# Patient Record
Sex: Male | Born: 1970 | Race: Black or African American | Hispanic: No | Marital: Married | Smoking: Never smoker
Health system: Southern US, Community
[De-identification: ages and names within clinical notes are randomized; demographics above are authoritative.]

## PROBLEM LIST (undated history)

## (undated) DIAGNOSIS — Z789 Other specified health status: Secondary | ICD-10-CM

## (undated) HISTORY — PX: ABDOMINAL SURGERY: SHX537

---

## 2007-09-11 ENCOUNTER — Emergency Department (HOSPITAL_COMMUNITY): Admission: EM | Admit: 2007-09-11 | Discharge: 2007-09-11 | Payer: Self-pay | Admitting: Emergency Medicine

## 2008-01-22 ENCOUNTER — Ambulatory Visit: Payer: Self-pay | Admitting: Family Medicine

## 2008-02-16 ENCOUNTER — Ambulatory Visit: Payer: Self-pay | Admitting: Family Medicine

## 2008-02-19 ENCOUNTER — Ambulatory Visit: Payer: Self-pay | Admitting: Hematology and Oncology

## 2010-07-06 ENCOUNTER — Emergency Department (HOSPITAL_COMMUNITY): Admission: EM | Admit: 2010-07-06 | Discharge: 2010-07-06 | Payer: Self-pay | Admitting: Emergency Medicine

## 2011-01-17 ENCOUNTER — Inpatient Hospital Stay (INDEPENDENT_AMBULATORY_CARE_PROVIDER_SITE_OTHER)
Admission: RE | Admit: 2011-01-17 | Discharge: 2011-01-17 | Disposition: A | Discharge status: Home / Self Care | Payer: Self-pay | Source: Ambulatory Visit | Attending: Emergency Medicine | Admitting: Emergency Medicine

## 2011-01-17 DIAGNOSIS — R1013 Epigastric pain: Secondary | ICD-10-CM
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
## 2011-05-31 ENCOUNTER — Encounter: Payer: Self-pay | Admitting: Family Medicine

## 2011-06-05 ENCOUNTER — Encounter: Payer: Self-pay | Admitting: Medical

## 2011-07-09 LAB — COMPREHENSIVE METABOLIC PANEL
ALT: 21
AST: 24
Albumin: 4.2
CO2: 28
Calcium: 9.4
Chloride: 102
Creatinine, Ser: 1.41
GFR calc Af Amer: >60
GFR calc non Af Amer: 58 — ABNORMAL LOW
Sodium: 138
Total Bilirubin: 1

## 2011-07-09 LAB — CBC
MCV: 84
Platelets: 267
RBC: 5.47
WBC: 4.7

## 2011-07-09 LAB — DIFFERENTIAL
Eosinophils Absolute: 0 — ABNORMAL LOW
Eosinophils Relative: 1
Lymphocytes Relative: 35
Lymphs Abs: 1.7
Monocytes Absolute: 0.1

## 2012-11-26 ENCOUNTER — Encounter: Payer: Self-pay | Admitting: Medical

## 2019-02-17 ENCOUNTER — Emergency Department (HOSPITAL_COMMUNITY)
Admission: EM | Admit: 2019-02-17 | Discharge: 2019-02-17 | Disposition: A | Discharge status: Home / Self Care | Payer: Medicaid Other | Attending: Emergency Medicine | Admitting: Emergency Medicine

## 2019-02-17 ENCOUNTER — Other Ambulatory Visit: Payer: Self-pay

## 2019-02-17 ENCOUNTER — Encounter (HOSPITAL_COMMUNITY): Payer: Self-pay

## 2019-02-17 DIAGNOSIS — M545 Low back pain: Secondary | ICD-10-CM | POA: Insufficient documentation

## 2019-02-17 HISTORY — DX: Other specified health status: Z78.9

## 2019-02-17 MED ORDER — NAPROXEN 250 MG PO TABS
500.0000 mg | ORAL_TABLET | Freq: Once | ORAL | Status: AC
  Administered 2019-02-17: 500 mg via ORAL
  Filled 2019-02-17: qty 2

## 2019-02-17 MED ORDER — METHOCARBAMOL 500 MG PO TABS
500.0000 mg | ORAL_TABLET | Freq: Once | ORAL | Status: AC
  Administered 2019-02-17: 500 mg via ORAL
  Filled 2019-02-17: qty 1

## 2019-02-17 MED ORDER — NAPROXEN 500 MG PO TABS: 500.0000 mg | ORAL_TABLET | Freq: Two times a day (BID) | ORAL | 0 refills | Status: DC

## 2019-02-17 MED ORDER — METHOCARBAMOL 500 MG PO TABS: 500.0000 mg | ORAL_TABLET | Freq: Two times a day (BID) | ORAL | 0 refills | Status: DC
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
## 2019-02-17 NOTE — Discharge Instructions (Signed)
Take the prescribed medication as directed-- meds sent to pharmacy as requested. Can also use heating pad to help with muscle soreness. Follow-up with your primary care doctor. Return to the ED for new or worsening symptoms.

## 2019-03-04 ENCOUNTER — Other Ambulatory Visit: Payer: Self-pay

## 2019-03-04 ENCOUNTER — Ambulatory Visit (HOSPITAL_COMMUNITY)
Admission: EM | Admit: 2019-03-04 | Discharge: 2019-03-04 | Disposition: A | Discharge status: Home / Self Care | Payer: Medicaid Other | Attending: Family Medicine | Admitting: Family Medicine

## 2019-03-04 ENCOUNTER — Encounter (HOSPITAL_COMMUNITY): Payer: Self-pay

## 2019-03-04 DIAGNOSIS — L255 Unspecified contact dermatitis due to plants, except food: Secondary | ICD-10-CM

## 2019-03-04 MED ORDER — PREDNISONE 10 MG (21) PO TBPK: ORAL_TABLET | Freq: Every day | ORAL | 0 refills | Status: DC

## 2019-03-04 NOTE — ED Provider Notes (Signed)
[facility]   [id number] 03/04/19 Arrival Time: 0934  ASSESSMENT & PLAN:  1. Rhus dermatitis     Meds ordered this encounter  Medications  . predniSONE (STERAPRED UNI-PAK 21 TAB) 10 MG (21) TBPK tablet    Sig: Take by mouth daily. Take as directed.    Dispense:  21 tablet    Refill:  0   Will follow up with PCP or here if worsening or failing to improve as anticipated. Reviewed expectations re: course of current medical issues. Questions answered. Outlined signs and symptoms indicating need for more acute intervention. Patient verbalized understanding. After Visit Summary given.   SUBJECTIVE:  Corey Crane is a 48 y.o. male who presents with a skin complaint.   Location: L forearm Onset: gradual Duration: 1 week Associated pruritis? moderate Associated pain? none Progression: stable  Drainage? No  Known trigger? No  New soaps/lotions/topicals/detergents/environmental exposures? No Contacts with similar? No Recent travel? No  Other associated symptoms: none Therapies tried thus far: none Arthralgia or myalgia? none Recent illness? none Fever? none No specific aggravating or alleviating factors reported.  ROS: As per HPI.  OBJECTIVE: Vitals:   03/04/19 1030  BP: 126/83  Pulse: 69  Resp: 18  Temp: 98.4 F (36.9 C)  TempSrc: Oral  SpO2: 97%    General appearance: alert; no distress Lungs: clear to auscultation bilaterally Heart: regular rate and rhythm Extremities: no edema Skin: warm and dry; signs of infection: no; approx 3x4 area of linear papules and vesicles with surrounding erythema over L forearm Psychological: alert and cooperative; normal mood and affect  No Known Allergies  Past Medical History:  Diagnosis Date  . Known health problems: none    Social History   Socioeconomic History  . Marital status: Married    Spouse name: Not on file  . Number of children: Not on file  . Years of education: Not on file  . Highest  education level: Not on file  Occupational History  . Not on file  Social Needs  . Financial resource strain: Not on file  . Food insecurity:    Worry: Not on file    Inability: Not on file  . Transportation needs:    Medical: Not on file    Non-medical: Not on file  Tobacco Use  . Smoking status: Never Smoker  . Smokeless tobacco: Never Used  Substance and Sexual Activity  . Alcohol use: Never    Frequency: Never  . Drug use: Never  . Sexual activity: Yes  Lifestyle  . Physical activity:    Days per week: Not on file    Minutes per session: Not on file  . Stress: Not on file  Relationships  . Social connections:    Talks on phone: Not on file    Gets together: Not on file    Attends religious service: Not on file    Active member of club or organization: Not on file    Attends meetings of clubs or organizations: Not on file    Relationship status: Not on file  . Intimate partner violence:    Fear of current or ex partner: Not on file    Emotionally abused: Not on file    Physically abused: Not on file    Forced sexual activity: Not on file  Other Topics Concern  . Not on file  Social History Narrative  . Not on file   Family History  Family history unknown: Yes   Past Surgical History:  Procedure Laterality Date  . ABDOMINAL SURGERY       Mardella Layman, MD 03/04/19 1041

## 2019-03-04 NOTE — ED Triage Notes (Signed)
Pt presents with rash on left arm X 7 days that is itchy.

## 2019-06-14 ENCOUNTER — Other Ambulatory Visit: Payer: Self-pay

## 2019-06-14 ENCOUNTER — Ambulatory Visit (HOSPITAL_COMMUNITY)
Admission: EM | Admit: 2019-06-14 | Discharge: 2019-06-14 | Disposition: A | Discharge status: Home / Self Care | Payer: Medicaid Other | Attending: Family Medicine | Admitting: Family Medicine

## 2019-06-14 DIAGNOSIS — H00014 Hordeolum externum left upper eyelid: Secondary | ICD-10-CM | POA: Diagnosis not present

## 2019-06-14 MED ORDER — SULFAMETHOXAZOLE-TRIMETHOPRIM 800-160 MG PO TABS: 1.0000 | ORAL_TABLET | Freq: Two times a day (BID) | ORAL | 0 refills | Status: DC

## 2019-06-14 MED ORDER — SULFACETAMIDE SODIUM 10 % OP SOLN: 1.0000 [drp] | OPHTHALMIC | 0 refills | Status: DC

## 2019-06-14 NOTE — Discharge Instructions (Addendum)
Warm compresses every couple of hours Expect improvement in a couple of days

## 2019-06-14 NOTE — ED Triage Notes (Signed)
Pt states he has swollen eyelids . Pt states they don't hurts.

## 2019-06-14 NOTE — ED Provider Notes (Signed)
[facility]    CSN: [id number] Arrival date & time: 06/14/19  1009      History   Chief Complaint Chief Complaint  Patient presents with  . stye    HPI Corey Crane is a 48 y.o. male.   HPI  Patient has a stye in each eye.  First in the right eyelid and in the left eyelid.  Mildly painful.  Here for management.  No problems with vision.  No drainage  Past Medical History:  Diagnosis Date  . Known health problems: none     There are no active problems to display for this patient.   Past Surgical History:  Procedure Laterality Date  . ABDOMINAL SURGERY         Home Medications    Prior to Admission medications   Medication Sig Start Date End Date Taking? Authorizing Provider  sulfacetamide (BLEPH-10) 10 % ophthalmic solution Place 1-2 drops into both eyes every 3 (three) hours while awake. 06/14/19   Eustace MooreNelson, Karrina Lye Sue, MD  sulfamethoxazole-trimethoprim (BACTRIM DS) 800-160 MG tablet Take 1 tablet by mouth 2 (two) times daily for 5 days. 06/14/19 06/19/19  Eustace MooreNelson, Essa Wenk Sue, MD    Family History Family History  Family history unknown: Yes    Social History Social History   Tobacco Use  . Smoking status: Never Smoker  . Smokeless tobacco: Never Used  Substance Use Topics  . Alcohol use: Never    Frequency: Never  . Drug use: Never     Allergies   Patient has no known allergies.   Review of Systems Review of Systems  Constitutional: Negative for chills and fever.  HENT: Negative for ear pain and sore throat.   Eyes: Positive for redness. Negative for pain and visual disturbance.  Respiratory: Negative for cough and shortness of breath.   Cardiovascular: Negative for chest pain and palpitations.  Gastrointestinal: Negative for abdominal pain and vomiting.  Genitourinary: Negative for dysuria and hematuria.  Musculoskeletal: Negative for arthralgias and back pain.  Skin: Negative for color change and rash.  Neurological: Negative for  seizures and syncope.  All other systems reviewed and are negative.    Physical Exam Triage Vital Signs ED Triage Vitals  Enc Vitals Group     BP 06/14/19 1029 119/87     Pulse Rate 06/14/19 1029 86     Resp 06/14/19 1029 18     Temp 06/14/19 1029 98.4 F (36.9 C)     Temp Source 06/14/19 1029 Oral     SpO2 06/14/19 1029 97 %     Weight 06/14/19 1026 185 lb (83.9 kg)     Height --      Head Circumference --      Peak Flow --      Pain Score 06/14/19 1025 0     Pain Loc --      Pain Edu? --      Excl. in [place]? --    No data found.  Updated Vital Signs BP 119/87 (BP Location: Right Arm)   Pulse 86   Temp 98.4 F (36.9 C) (Oral)   Resp 18   Wt 83.9 kg   SpO2 97%   BMI 27.32 kg/m   Visual Acuity Right Eye Distance:   Left Eye Distance:   Bilateral Distance:    Right Eye Near:   Left Eye Near:    Bilateral Near:     Physical Exam Constitutional:      General: He is not  in acute distress.    Appearance: He is well-developed.  HENT:     Head: Normocephalic and atraumatic.  Eyes:     Conjunctiva/sclera: Conjunctivae normal.     Pupils: Pupils are equal, round, and reactive to light.     Comments: Small hordeolum is present in each upper eyelid  Neck:     Musculoskeletal: Normal range of motion.  Cardiovascular:     Rate and Rhythm: Normal rate.  Pulmonary:     Effort: Pulmonary effort is normal. No respiratory distress.  Abdominal:     General: There is no distension.     Palpations: Abdomen is soft.  Musculoskeletal: Normal range of motion.  Skin:    General: Skin is warm and dry.  Neurological:     Mental Status: He is alert.      UC Treatments / Results  Labs (all labs ordered are listed, but only abnormal results are displayed) Labs Reviewed - No data to display  EKG   Radiology No results found.  Procedures Procedures (including critical care time)  Medications Ordered in UC Medications - No data to display  Initial Impression /  Assessment and Plan / UC Course  I have reviewed the triage vital signs and the nursing notes.  Pertinent labs & imaging results that were available during my care of the patient were reviewed by me and considered in my medical decision making (see chart for details).   * Final Clinical Impressions(s) / UC Diagnoses   Final diagnoses:  Hordeolum externum of left upper eyelid     Discharge Instructions     Warm compresses every couple of hours Expect improvement in a couple of days   ED Prescriptions    Medication Sig Dispense Auth. Provider   sulfacetamide (BLEPH-10) 10 % ophthalmic solution Place 1-2 drops into both eyes every 3 (three) hours while awake. 10 mL Raylene Everts, MD   sulfamethoxazole-trimethoprim (BACTRIM DS) 800-160 MG tablet Take 1 tablet by mouth 2 (two) times daily for 5 days. 10 tablet Raylene Everts, MD     Controlled Substance Prescriptions [place] Controlled Substance Registry consulted? Not Applicable   Raylene Everts, MD 06/14/19 1047

## 2019-06-17 ENCOUNTER — Other Ambulatory Visit: Payer: Self-pay

## 2019-06-17 ENCOUNTER — Encounter (HOSPITAL_COMMUNITY): Payer: Self-pay | Admitting: Family Medicine

## 2019-06-17 ENCOUNTER — Ambulatory Visit (HOSPITAL_COMMUNITY)
Admission: EM | Admit: 2019-06-17 | Discharge: 2019-06-17 | Disposition: A | Discharge status: Home / Self Care | Payer: Medicaid Other | Attending: Family Medicine | Admitting: Family Medicine

## 2019-06-17 DIAGNOSIS — H00019 Hordeolum externum unspecified eye, unspecified eyelid: Secondary | ICD-10-CM

## 2019-06-17 MED ORDER — DOXYCYCLINE HYCLATE 100 MG PO TABS: 100.0000 mg | ORAL_TABLET | Freq: Two times a day (BID) | ORAL | 0 refills | Status: DC

## 2019-06-17 NOTE — ED Triage Notes (Signed)
Pt presents for follow up for swelling and irritation with both eye lids since Sunday.

## 2019-06-17 NOTE — ED Provider Notes (Signed)
[facility]    CSN: [id number] Arrival date & time: 06/17/19  9678      History   Chief Complaint Chief Complaint  Patient presents with  . Follow-up    HPI Corey Crane is a 48 y.o. male.   estabished [facility] here for f/u of hordeolum seen 9/13  Patient works from home.  Now the other eye has swollen.  He uses hot compresses intermittently.  No fever, vision change.  Taking drops and antibiotic as directed.     Past Medical History:  Diagnosis Date  . Known health problems: none     There are no active problems to display for this patient.   Past Surgical History:  Procedure Laterality Date  . ABDOMINAL SURGERY         Home Medications    Prior to Admission medications   Medication Sig Start Date End Date Taking? Authorizing Provider  doxycycline (VIBRA-TABS) 100 MG tablet Take 1 tablet (100 mg total) by mouth 2 (two) times daily. 06/17/19   Robyn Haber, MD    Family History Family History  Family history unknown: Yes    Social History Social History   Tobacco Use  . Smoking status: Never Smoker  . Smokeless tobacco: Never Used  Substance Use Topics  . Alcohol use: Never    Frequency: Never  . Drug use: Never     Allergies   Patient has no known allergies.   Review of Systems Review of Systems  Eyes: Positive for pain.  All other systems reviewed and are negative.    Physical Exam Triage Vital Signs ED Triage Vitals  Enc Vitals Group     BP      Pulse      Resp      Temp      Temp src      SpO2      Weight      Height      Head Circumference      Peak Flow      Pain Score      Pain Loc      Pain Edu?      Excl. in [place]?    No data found.  Updated Vital Signs BP 124/76 (BP Location: Left Arm)   Pulse 87   Temp 98.3 F (36.8 C) (Oral)   Resp 18   SpO2 98%      Physical Exam Vitals signs and nursing note reviewed.  Constitutional:      General: He is not in acute distress.    Appearance: He is  normal weight. He is not ill-appearing.  Neck:     Musculoskeletal: Normal range of motion.  Pulmonary:     Effort: Pulmonary effort is normal.  Musculoskeletal: Normal range of motion.  Skin:    General: Skin is warm and dry.  Neurological:     General: No focal deficit present.     Mental Status: He is alert and oriented to person, place, and time.  Psychiatric:        Mood and Affect: Mood normal.        Thought Content: Thought content normal.        Judgment: Judgment normal.      UC Treatments / Results  Labs (all labs ordered are listed, but only abnormal results are displayed) Labs Reviewed - No data to display  EKG   Radiology No results found.  Procedures Procedures (including critical care time)  Medications Ordered  in UC Medications - No data to display  Initial Impression / Assessment and Plan / UC Course  I have reviewed the triage vital signs and the nursing notes.  Pertinent labs & imaging results that were available during my care of the patient were reviewed by me and considered in my medical decision making (see chart for details).    Final Clinical Impressions(s) / UC Diagnoses   Final diagnoses:  Hordeolum externum, unspecified laterality     Discharge Instructions     Use the hot compresses every hour while awake.    ED Prescriptions    Medication Sig Dispense Auth. Provider   doxycycline (VIBRA-TABS) 100 MG tablet Take 1 tablet (100 mg total) by mouth 2 (two) times daily. 20 tablet Elvina SidleLauenstein, Navie Lamoreaux, MD     Controlled Substance Prescriptions [place] Controlled Substance Registry consulted? Not Applicable   Elvina SidleLauenstein, Cahterine Heinzel, MD 06/17/19 [phone]

## 2019-06-17 NOTE — Discharge Instructions (Addendum)
Use the hot compresses every hour while awake.

## 2019-09-07 ENCOUNTER — Other Ambulatory Visit: Payer: Self-pay

## 2019-09-07 ENCOUNTER — Encounter (HOSPITAL_COMMUNITY): Payer: Self-pay

## 2019-09-07 ENCOUNTER — Ambulatory Visit (HOSPITAL_COMMUNITY)
Admission: EM | Admit: 2019-09-07 | Discharge: 2019-09-07 | Disposition: A | Discharge status: Home / Self Care | Payer: Medicaid Other | Attending: Internal Medicine | Admitting: Internal Medicine

## 2019-09-07 DIAGNOSIS — H0013 Chalazion right eye, unspecified eyelid: Secondary | ICD-10-CM

## 2019-09-07 DIAGNOSIS — H0016 Chalazion left eye, unspecified eyelid: Secondary | ICD-10-CM

## 2019-09-07 NOTE — ED Triage Notes (Signed)
Patient presents to Urgent Care with complaints of "bumps on both eyelids" since "a while ago". Patient reports he was seen for same in just one eye in the past but now the other eye has one too, pt denies pain.

## 2019-09-07 NOTE — ED Provider Notes (Signed)
Corey Crane    CSN: [id number] Arrival date & time: 09/07/19  6144      History   Chief Complaint Chief Complaint  Patient presents with  . Stye    HPI Corey Crane is a 48 y.o. male with no past medical history comes to urgent care with complaints of swelling in both upper eyelids.  Patient was seen in September and was managed for a stye in the left eye.  Following that the patient says that the swelling in both upper eyelids have progressed.  Is not improved with warm compress.  Both lesions are painless.  No discharge from the eyes.  No changes in his vision.  No known relieving factors.   HPI  Past Medical History:  Diagnosis Date  . Known health problems: none     There are no active problems to display for this patient.   Past Surgical History:  Procedure Laterality Date  . ABDOMINAL SURGERY         Home Medications    Prior to Admission medications   Not on File    Family History Family History  Problem Relation Age of Onset  . Healthy Mother   . Healthy Father     Social History Social History   Tobacco Use  . Smoking status: Never Smoker  . Smokeless tobacco: Never Used  Substance Use Topics  . Alcohol use: Never    Frequency: Never  . Drug use: Never     Allergies   Patient has no known allergies.   Review of Systems Review of Systems  Constitutional: Negative.   HENT: Negative for congestion, postnasal drip and sore throat.   Eyes: Negative for photophobia, pain, discharge, redness, itching and visual disturbance.  Respiratory: Negative.   Cardiovascular: Negative.   Gastrointestinal: Negative.   Genitourinary: Negative.   Musculoskeletal: Negative.  Negative for arthralgias and myalgias.     Physical Exam Triage Vital Signs ED Triage Vitals  Enc Vitals Group     BP 09/07/19 0943 123/75     Pulse Rate 09/07/19 0943 70     Resp 09/07/19 0943 16     Temp 09/07/19 0943 98.5 F (36.9 C)     Temp Source  09/07/19 0943 Oral     SpO2 09/07/19 0943 100 %     Weight --      Height --      Head Circumference --      Peak Flow --      Pain Score 09/07/19 0942 0     Pain Loc --      Pain Edu? --      Excl. in [place]? --    No data found.  Updated Vital Signs BP 123/75 (BP Location: Left Arm)   Pulse 70   Temp 98.5 F (36.9 C) (Oral)   Resp 16   SpO2 100%   Visual Acuity Right Eye Distance:   Left Eye Distance:   Bilateral Distance:    Right Eye Near:   Left Eye Near:    Bilateral Near:     Physical Exam Vitals signs and nursing note reviewed.  Constitutional:      Appearance: He is not ill-appearing.  Eyes:     Comments: Chalazion involving both upper eyelids.  Cardiovascular:     Rate and Rhythm: Normal rate and regular rhythm.     Pulses: Normal pulses.     Heart sounds: Normal heart sounds.  Pulmonary:     Effort:  Pulmonary effort is normal. No respiratory distress.     Breath sounds: Normal breath sounds. No rhonchi or rales.  Abdominal:     General: Bowel sounds are normal.     Palpations: Abdomen is soft.  Musculoskeletal: Normal range of motion.        General: No swelling, tenderness or deformity.  Skin:    General: Skin is warm and dry.     Capillary Refill: Capillary refill takes less than 2 seconds.     Findings: No bruising or erythema.  Neurological:     General: No focal deficit present.     Mental Status: He is alert and oriented to person, place, and time.      UC Treatments / Results  Labs (all labs ordered are listed, but only abnormal results are displayed) Labs Reviewed - No data to display  EKG   Radiology No results found.  Procedures Procedures (including critical care time)  Medications Ordered in UC Medications - No data to display  Initial Impression / Assessment and Plan / UC Course  I have reviewed the triage vital signs and the nursing notes.  Pertinent labs & imaging results that were available during my care of the  patient were reviewed by me and considered in my medical decision making (see chart for details).     1.  Chalazion of both eyes: Patient is advised to follow-up with ophthalmology for further evaluation. In the meantime patient can continue warm compresses. I suspect that the chalazion is a sequelae of external hordeolum in the past. No indication for antibiotics at this time. Final Clinical Impressions(s) / UC Diagnoses   Final diagnoses:  Chalazion of both eyes   Discharge Instructions   None    ED Prescriptions    None     PDMP not reviewed this encounter.   Merrilee Jansky, MD 09/07/19 1101

## 2020-07-31 ENCOUNTER — Other Ambulatory Visit: Payer: Self-pay

## 2020-07-31 ENCOUNTER — Encounter (HOSPITAL_COMMUNITY): Payer: Self-pay | Admitting: *Deleted

## 2020-07-31 ENCOUNTER — Ambulatory Visit (HOSPITAL_COMMUNITY)
Admission: EM | Admit: 2020-07-31 | Discharge: 2020-07-31 | Disposition: A | Discharge status: Home / Self Care | Payer: Medicaid Other | Attending: Family Medicine | Admitting: Family Medicine

## 2020-07-31 DIAGNOSIS — L03012 Cellulitis of left finger: Secondary | ICD-10-CM | POA: Diagnosis not present

## 2020-07-31 MED ORDER — LIDOCAINE HCL 2 % IJ SOLN
INTRAMUSCULAR | Status: AC
  Filled 2020-07-31: qty 20

## 2020-07-31 MED ORDER — DOXYCYCLINE HYCLATE 100 MG PO CAPS: 100.0000 mg | ORAL_CAPSULE | Freq: Two times a day (BID) | ORAL | 0 refills | Status: AC

## 2020-07-31 NOTE — ED Provider Notes (Signed)
[facility]    CSN: [id number] Arrival date & time: 07/31/20  1553      History   Chief Complaint Chief Complaint  Patient presents with  . Recurrent Skin Infections    HPI Corey Crane is a 49 y.o. male.   HPI Patient presents for evaluation of left middle finger nail bed pain. Patient reports that he bites his nails. He noticed a hangnail protruding from his finger which he removed by biting the nail away and feels that he may have torn a piece of skin.  Over the last couple days he has developed increased swelling and pain at the tissue surrounding the nailbed.  He denies any visible drainage.  Pain is also extended to the posterior portion of his finger.  He denies any chills, fever, nausea or vomiting.  Past Medical History:  Diagnosis Date  . Known health problems: none     There are no problems to display for this patient.   Past Surgical History:  Procedure Laterality Date  . ABDOMINAL SURGERY         Home Medications    Prior to Admission medications   Not on File    Family History Family History  Problem Relation Age of Onset  . Healthy Mother   . Healthy Father     Social History Social History   Tobacco Use  . Smoking status: Never Smoker  . Smokeless tobacco: Never Used  Vaping Use  . Vaping Use: Never used  Substance Use Topics  . Alcohol use: Never  . Drug use: Never     Allergies   Patient has no known allergies.   Review of Systems Review of Systems Pertinent negatives listed in HPI Physical Exam Triage Vital Signs ED Triage Vitals  Enc Vitals Group     BP 07/31/20 1648 129/90     Pulse Rate 07/31/20 1648 85     Resp 07/31/20 1648 16     Temp 07/31/20 1648 98.6 F (37 C)     Temp Source 07/31/20 1648 Oral     SpO2 07/31/20 1648 100 %     Weight 07/31/20 1651 196 lb (88.9 kg)     Height 07/31/20 1651 5\' 8"  (1.727 m)     Head Circumference --      Peak Flow --      Pain Score 07/31/20 1650 7     Pain  Loc --      Pain Edu? --      Excl. in GC? --    No data found.  Updated Vital Signs BP 129/90 (BP Location: Right Arm)   Pulse 85   Temp 98.6 F (37 C) (Oral)   Resp 16   Ht 5\' 8"  (1.727 m)   Wt 196 lb (88.9 kg)   SpO2 100%   BMI 29.80 kg/m   Visual Acuity Right Eye Distance:   Left Eye Distance:   Bilateral Distance:    Right Eye Near:   Left Eye Near:    Bilateral Near:     Physical Exam General appearance: alert, well developed, well nourished, cooperative and in no distress Head: Normocephalic, without obvious abnormality, atraumatic Respiratory: Respirations even and unlabored, normal respiratory rate Heart: rate and rhythm normal. No gallop or murmurs noted on exam  Extremities: Left Hand: left 3rd digit paronychia present   Skin: Skin color, texture, turgor normal. No rashes seen  Psych: Appropriate mood and affect. UC Treatments / Results  Labs (all labs  ordered are listed, but only abnormal results are displayed) Labs Reviewed - No data to display  EKG   Radiology No results found.  Procedures Incision and Drainage  Date/Time: 08/04/2020 10:40 AM Performed by: Bing Neighbors, FNP Authorized by: Bing Neighbors, FNP   Consent:    Consent obtained:  Verbal   Consent given by:  Patient   Risks discussed:  Incomplete drainage   Alternatives discussed:  No treatment Location:    Type:  Fluid collection   Size:  3 mm   Location:  Upper extremity   Upper extremity location:  Finger   Finger location:  L long finger Pre-procedure details:    Skin preparation:  Antiseptic wash Anesthesia (see MAR for exact dosages):    Anesthesia method:  Local infiltration   Local anesthetic:  Lidocaine 2% w/o epi Procedure type:    Complexity:  Simple Procedure details:    Needle aspiration: no     Incision types:  Stab incision   Incision depth:  Dermal   Scalpel blade:  10   Drainage:  Purulent   Drainage amount:  Moderate   Packing materials:   1/2 in gauze Post-procedure details:    Patient tolerance of procedure:  Tolerated well, no immediate complications   (including critical care time)  Medications Ordered in UC Medications - No data to display  Initial Impression / Assessment and Plan / UC Course  I have reviewed the triage vital signs and the nursing notes.  Pertinent labs & imaging results that were available during my care of the patient were reviewed by me and considered in my medical decision making (see chart for details).     Paronychia drained successfully.  Encourage patient to bleed dressing in place.  Prescribed doxycycline given the pharmacy of express drainage from site.  Red flags discussed.  Patient verbalized understanding agreement with plan.   Final Clinical Impressions(s) / UC Diagnoses   Final diagnoses:  Paronychia of finger, left     Discharge Instructions     Change dressing at least once or twice daily.  Current dressing leave in place for 2 to 3 hours to allow any excess drainage to occur.  Complete all medication as prescribed.  Take medication with food.    ED Prescriptions    Medication Sig Dispense Auth. Provider   doxycycline (VIBRAMYCIN) 100 MG capsule Take 1 capsule (100 mg total) by mouth 2 (two) times daily for 5 days. 10 capsule Bing Neighbors, FNP     PDMP not reviewed this encounter.   Bing Neighbors, FNP 08/04/20 1041

## 2020-07-31 NOTE — ED Triage Notes (Signed)
PT presents with swelling to base of nail bed on LT middle finger. Pt reports pain 7/10.

## 2020-07-31 NOTE — Discharge Instructions (Signed)
Change dressing at least once or twice daily.  Current dressing leave in place for 2 to 3 hours to allow any excess drainage to occur.  Complete all medication as prescribed.  Take medication with food.

## 2020-08-04 DIAGNOSIS — L03012 Cellulitis of left finger: Secondary | ICD-10-CM | POA: Diagnosis not present
